# Patient Record
Sex: Female | Born: 1985 | Race: White | Hispanic: No | Marital: Married | Smoking: Never smoker
Health system: Southern US, Community
[De-identification: ages and names within clinical notes are randomized; demographics above are authoritative.]

## PROBLEM LIST (undated history)

## (undated) DIAGNOSIS — J302 Other seasonal allergic rhinitis: Secondary | ICD-10-CM

## (undated) DIAGNOSIS — G43909 Migraine, unspecified, not intractable, without status migrainosus: Secondary | ICD-10-CM

## (undated) DIAGNOSIS — N3281 Overactive bladder: Secondary | ICD-10-CM

## (undated) HISTORY — DX: Overactive bladder: N32.81

## (undated) HISTORY — DX: Migraine, unspecified, not intractable, without status migrainosus: G43.909

---

## 2003-06-25 ENCOUNTER — Other Ambulatory Visit: Admission: RE | Admit: 2003-06-25 | Discharge: 2003-06-25 | Payer: Self-pay | Admitting: Obstetrics & Gynecology

## 2003-09-29 ENCOUNTER — Other Ambulatory Visit: Admission: RE | Admit: 2003-09-29 | Discharge: 2003-09-29 | Payer: Self-pay | Admitting: Obstetrics and Gynecology

## 2004-08-08 ENCOUNTER — Other Ambulatory Visit: Admission: RE | Admit: 2004-08-08 | Discharge: 2004-08-08 | Payer: Self-pay | Admitting: Gynecology

## 2004-12-02 ENCOUNTER — Inpatient Hospital Stay (HOSPITAL_COMMUNITY): Admission: AD | Admit: 2004-12-02 | Discharge: 2004-12-02 | Payer: Self-pay | Admitting: Gynecology

## 2004-12-03 ENCOUNTER — Inpatient Hospital Stay (HOSPITAL_COMMUNITY): Admission: AD | Admit: 2004-12-03 | Discharge: 2004-12-03 | Payer: Self-pay | Admitting: Gynecology

## 2005-01-08 ENCOUNTER — Inpatient Hospital Stay (HOSPITAL_COMMUNITY): Admission: AD | Admit: 2005-01-08 | Discharge: 2005-01-08 | Payer: Self-pay | Admitting: Obstetrics and Gynecology

## 2005-02-01 ENCOUNTER — Inpatient Hospital Stay (HOSPITAL_COMMUNITY): Admission: AD | Admit: 2005-02-01 | Discharge: 2005-02-05 | Payer: Self-pay | Admitting: Gynecology

## 2005-02-01 ENCOUNTER — Inpatient Hospital Stay (HOSPITAL_COMMUNITY): Admission: AD | Admit: 2005-02-01 | Discharge: 2005-02-01 | Payer: Self-pay | Admitting: Gynecology

## 2005-02-02 ENCOUNTER — Encounter (INDEPENDENT_AMBULATORY_CARE_PROVIDER_SITE_OTHER): Payer: Self-pay | Admitting: *Deleted

## 2005-03-15 ENCOUNTER — Other Ambulatory Visit: Admission: RE | Admit: 2005-03-15 | Discharge: 2005-03-15 | Payer: Self-pay | Admitting: Gynecology

## 2006-09-25 IMAGING — US US OB LIMITED
1 series · 13 of 28 positions shown · non-contrast
Comparison: none

CLINICAL DATA: 19 year-old female with 26 week 4 day gestation with pelvic pain and contractions.

[Series 1: us ob limited · 0.37mm/px · 13 of 32 slices shown]
[im 2/32]
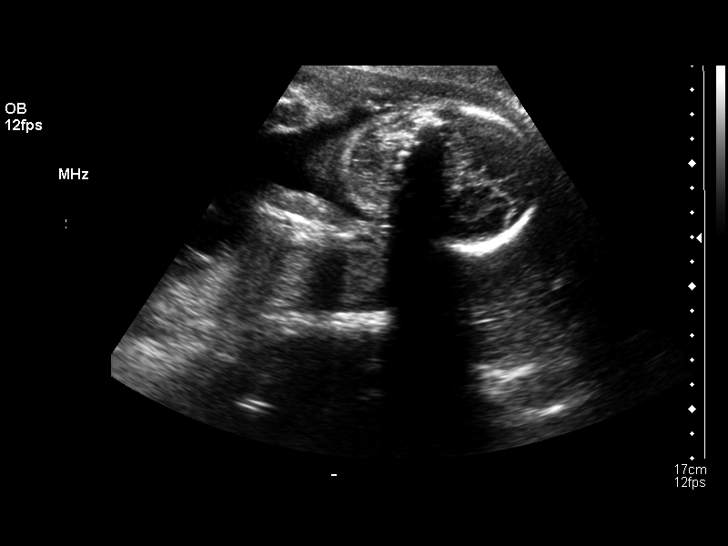
[im 4/32]
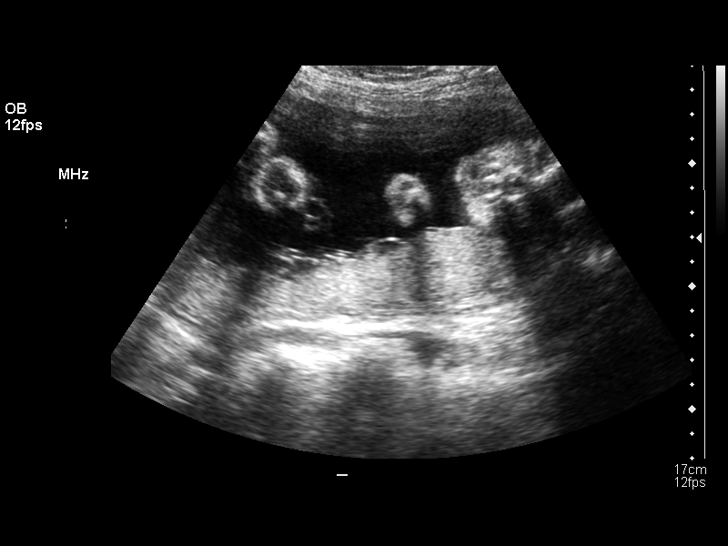
[im 6/32]
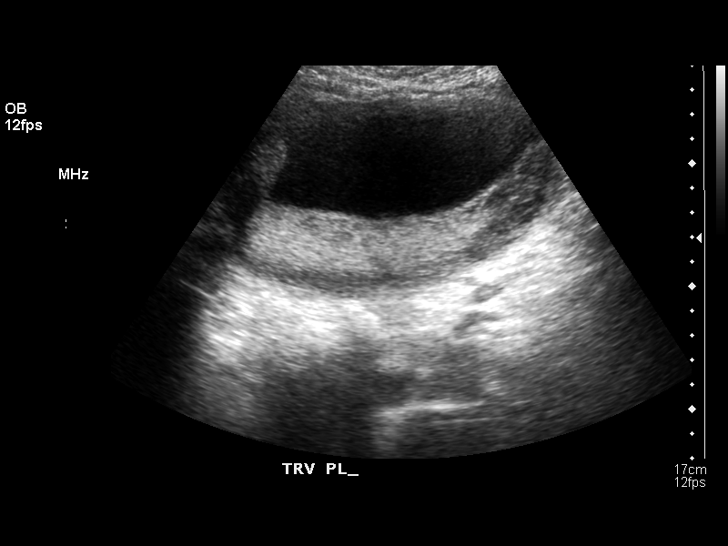
[im 9/32]
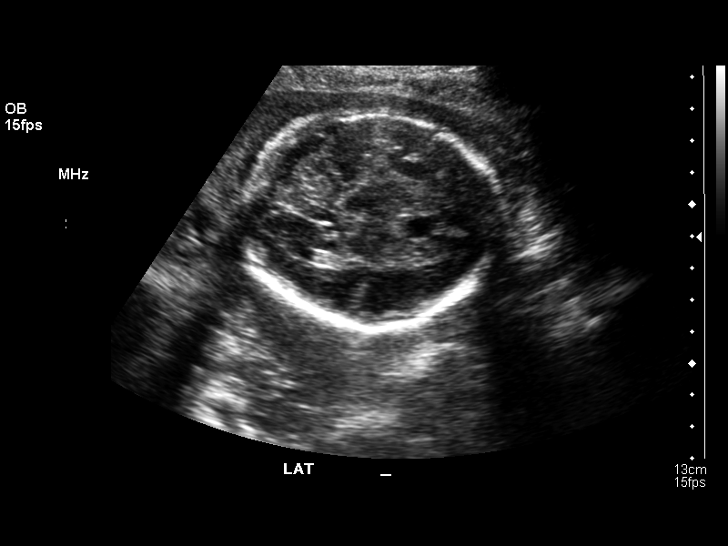
[im 11/32]
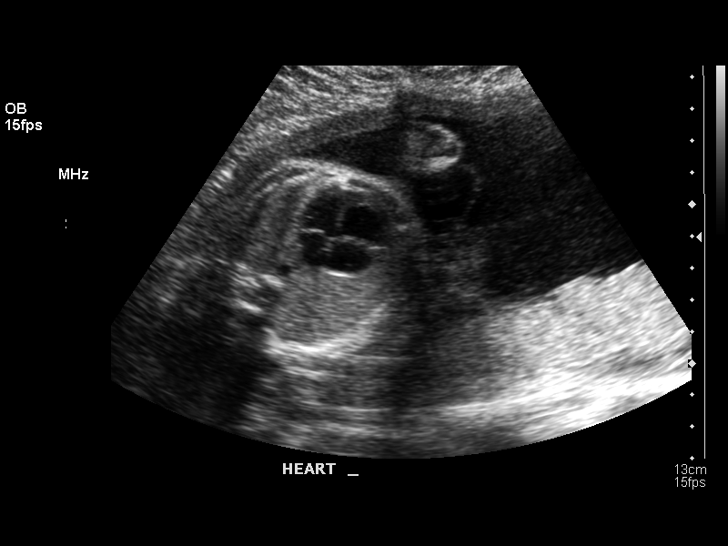
[im 13/32]
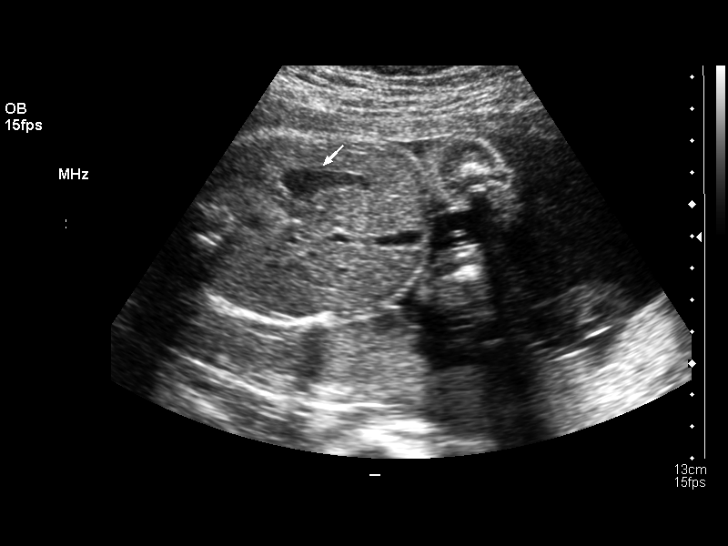
[im 17/32]
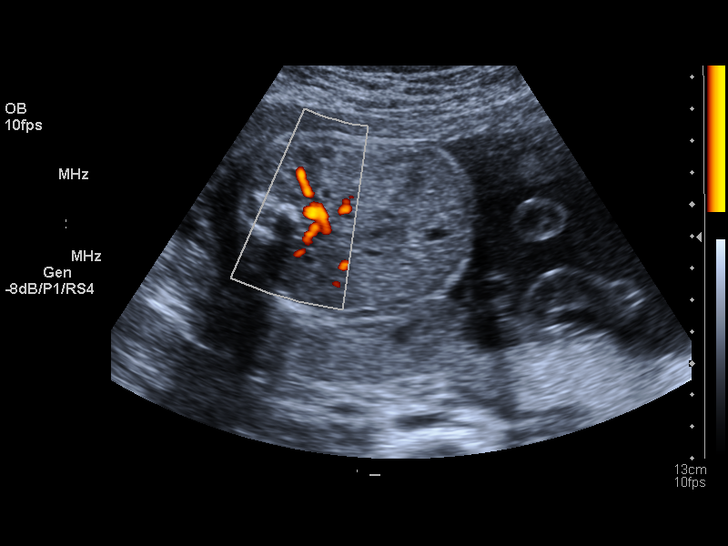
[im 19/32]
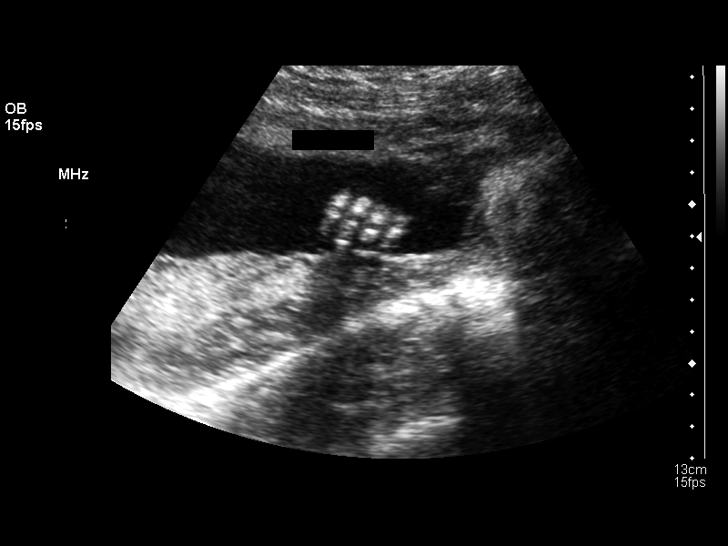
[im 21/32]
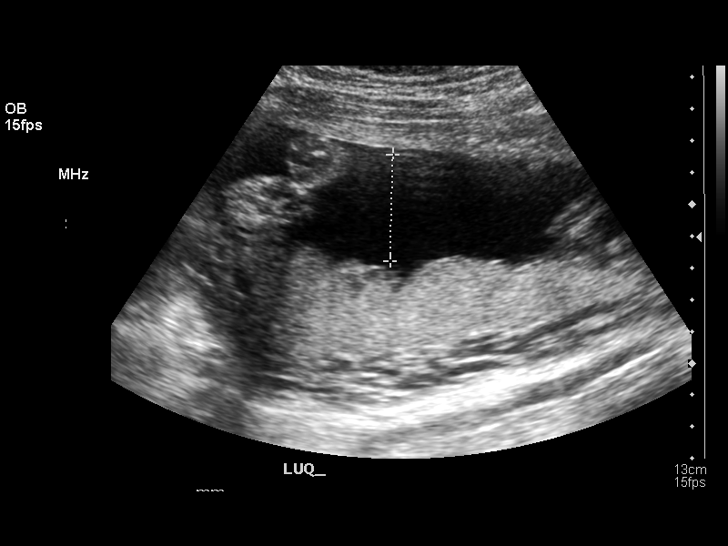
[im 23/32]
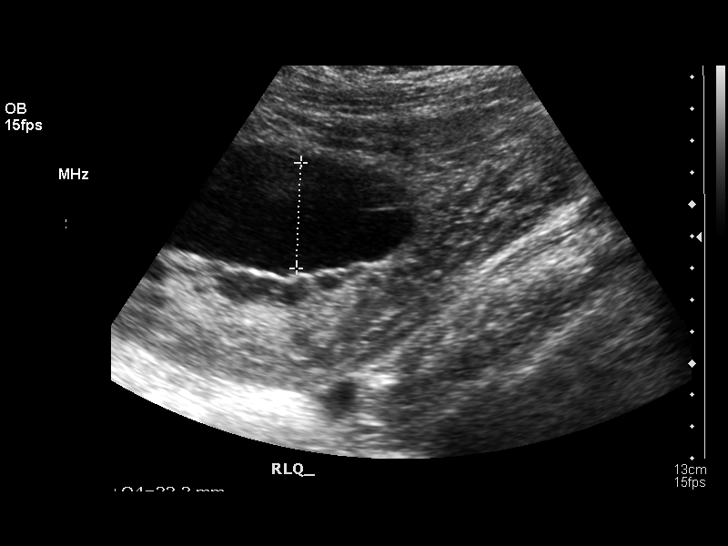
[im 26/32]
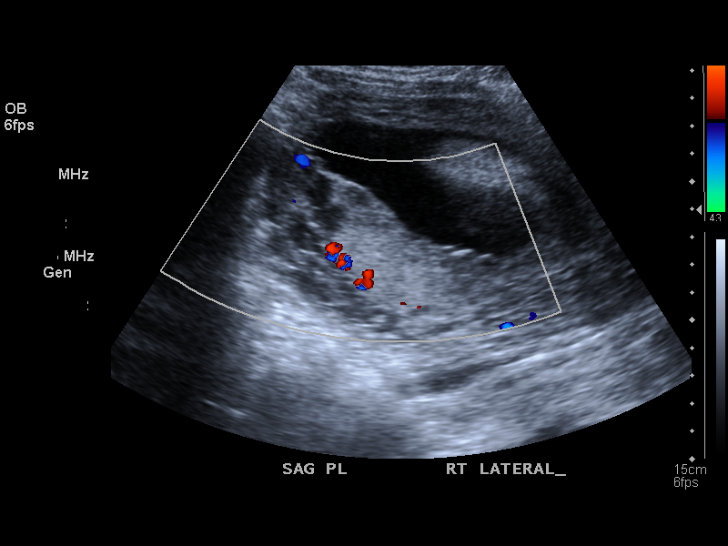
[im 28/32]
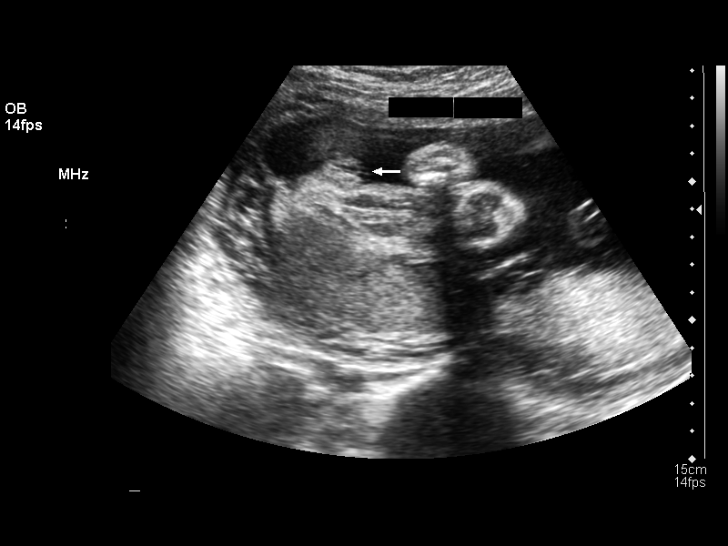
[im 30/32]
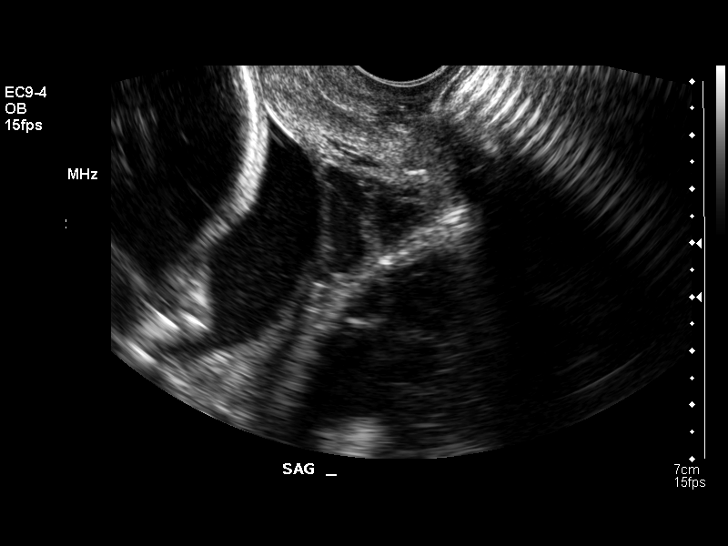

[13 of 28 positions shown; findings below may reference images not displayed]

TRANSABDOMINAL AND TRANSVAGINAL LIMITED OBSTETRICAL EVALUATION:
 Number of Fetuses: 1
 Heart Rate:  131 bpm
 Movement:  yes
 Breathing:  no
 Presentation:  cephalic
 Placental Location:  posterior
 Grade:  I
 Previa:  no 
 Amniotic Fluid (Subjective):  normal
 Amniotic Fluid (Objective):  11.5 cm AFI (3th-63th %ile =  9.5 to 32.6 cm for 27 weeks) 

 Fetal measurements and complete anatomic evaluation were not requested.  The following fetal anatomy was visualized during this exam:  stomach, three-vessel cord, cord insertion site, kidneys, bladder.

 MATERNAL UTERINE AND ADNEXAL FINDINGS
 Cervix: Range of 2.1 to 2.6 cm 
 COMMENT:  Prominent vessels are noted located posterior to the cervix but not near the internal os.
IMPRESSION: 1.  Single living intrauterine gestation in cephalic presentation with assigned gestational age of 26 weeks 4 days.
 2.  Dynamic cervix measuring from 2.1 to 2.6 cm. 
 3.  Prominent vessels posterior to the cervix, probably uterine, but consider follow-up as indicated.
 4.  Limited fetal anatomy from fetal position and slightly advanced gestational age.

## 2007-02-03 ENCOUNTER — Inpatient Hospital Stay (HOSPITAL_COMMUNITY): Admission: AD | Admit: 2007-02-03 | Discharge: 2007-02-03 | Payer: Self-pay | Admitting: Obstetrics and Gynecology

## 2007-02-13 ENCOUNTER — Inpatient Hospital Stay (HOSPITAL_COMMUNITY): Admission: AD | Admit: 2007-02-13 | Discharge: 2007-02-13 | Payer: Self-pay | Admitting: Obstetrics and Gynecology

## 2007-02-14 ENCOUNTER — Inpatient Hospital Stay (HOSPITAL_COMMUNITY): Admission: AD | Admit: 2007-02-14 | Discharge: 2007-02-14 | Payer: Self-pay | Admitting: Obstetrics and Gynecology

## 2007-03-17 ENCOUNTER — Inpatient Hospital Stay (HOSPITAL_COMMUNITY): Admission: AD | Admit: 2007-03-17 | Discharge: 2007-03-20 | Payer: Self-pay | Admitting: Obstetrics and Gynecology

## 2008-03-31 ENCOUNTER — Emergency Department: Payer: Self-pay | Admitting: Emergency Medicine

## 2008-03-31 ENCOUNTER — Other Ambulatory Visit: Payer: Self-pay

## 2009-11-21 ENCOUNTER — Inpatient Hospital Stay (HOSPITAL_COMMUNITY): Admission: AD | Admit: 2009-11-21 | Discharge: 2009-11-24 | Payer: Self-pay | Admitting: Obstetrics and Gynecology

## 2009-11-22 ENCOUNTER — Encounter (INDEPENDENT_AMBULATORY_CARE_PROVIDER_SITE_OTHER): Payer: Self-pay | Admitting: Obstetrics & Gynecology

## 2010-12-13 LAB — URINALYSIS, ROUTINE W REFLEX MICROSCOPIC
Glucose, UA: NEGATIVE mg/dL
Hgb urine dipstick: NEGATIVE
Ketones, ur: NEGATIVE mg/dL
Protein, ur: NEGATIVE mg/dL
Specific Gravity, Urine: <1.005 — ABNORMAL LOW (ref 1.005–1.030)

## 2010-12-17 LAB — CBC
HCT: 38.7 % (ref 36.0–46.0)
Hemoglobin: 12.9 g/dL (ref 12.0–15.0)
MCHC: 33.4 g/dL (ref 30.0–36.0)
MCHC: 33.6 g/dL (ref 30.0–36.0)
MCV: 90.8 fL (ref 78.0–100.0)
MCV: 90.8 fL (ref 78.0–100.0)
Platelets: 138 10*3/uL — ABNORMAL LOW (ref 150–400)
WBC: 10.9 10*3/uL — ABNORMAL HIGH (ref 4.0–10.5)
WBC: 9.7 10*3/uL (ref 4.0–10.5)

## 2010-12-17 LAB — RPR: RPR Ser Ql: NONREACTIVE

## 2011-02-09 NOTE — Discharge Summary (Signed)
NAME:  FINDLEY, BLANKENBAKER            ACCOUNT NO.:  [id number]   MEDICAL RECORD NO.:  [id number]          PATIENT TYPE:  INP   LOCATION:  9132                          FACILITY:  [facility]   PHYSICIAN:  Juan H. Lily Peer, M.D.DATE OF BIRTH:  10-04-1985   DATE OF ADMISSION:  02/01/2005  DATE OF DISCHARGE:  02/05/2005                                 DISCHARGE SUMMARY   HISTORY:  The patient is a 25 year old, gravida 2, para 0, AB 1 who was  admitted on May 11 with a complaint of contractions and was kept overnight  for observation. On the morning of the 12th when she was getting ready to be  discharged, she had spontaneous rupture of membranes and was augmented with  Pitocin. She was GBS positive and had been placed on penicillin G for  prophylaxis. The patient eventually had developed signs of chorioamnionitis  and arrest of cervical dilatation with 3 cm and evidence of fetal  tachycardia and was taken to the operating room where Dr. Loreli Dollar  performed a primary lower uterine segment transverse cesarean section and  delivered a viable female infant weighing 5 pounds 6 ounces with arterial  cord pH of 7.31 and Apgar's of 8 and 9. She had a blood loss of 600 mL. The  patient had been kept on Unasyn antibiotics postoperatively because of the  clinical findings of suspicion for her chorioamnionitis and continued to  remain afebrile on the Unasyn and it was discontinued on the second  postoperative day. She was up and ambulating, voiding well, tolerating a  regular diet well. Her hemoglobin was 11.6. Her blood type was O positive,  she was rubella immune. She was ready to be discharged home on her third  postoperative day. Her incision site was intact. She had passed gas and was  afebrile.   FINAL DIAGNOSES:  1.  Intrauterine pregnancy at 35 weeks and 2/7 days delivered.  2.  Preterm premature rupture of membranes.  3.  Chorioamnionitis.  4.  Arrest of first stage of labor.  5.   Positive group B strep culture.   PROCEDURE:  1.  Fetal monitoring.  2.  Intravenous antibiotics for prophylaxis.  3.  Primary lower uterine segment transverse cesarean section.   DISPOSITION:  The patient was discharged home on her third postoperative  day, she was ambulating, voiding well, tolerating a regular diet well. Her  incision was intact. Her staples to be removed, incision was steri-stripped.  The patient was given a prescription of Lortab 7.5/500 to take 1 p.o. q.4-6h  p.r.n. pain. Also prescription Motrin 800 mg t.i.d. to take p.r.n. She was  instructed to continue her prenatal vitamins and return to the office in six  weeks for a postop visit. Discharge instructions were provided.      JHF/MEDQ  D:  02/05/2005  T:  02/05/2005  Job:  [id number]

## 2011-02-09 NOTE — H&P (Signed)
NAME:  Diana Huang, BARNHARDT            ACCOUNT NO.:  [id number]   MEDICAL RECORD NO.:  [id number]             PATIENT TYPE:  INP   LOCATION:  9169                          FACILITY:  [facility]   PHYSICIAN:  Juan H. Lily Peer, M.D.DATE OF BIRTH:  [birth year]   DATE OF ADMISSION:  02/01/2005  DATE OF DISCHARGE:                                HISTORY & PHYSICAL   CHIEF COMPLAINT:  Contraction.   The patient is a 25 year old, gravida 2, para 0, AB 1.  Estimated date of  confinement, March 06, 2005.  The patient 35-2/[redacted] weeks gestation, presented  to the office today complaining of lower abdominal pressure.  She had been  seen in the office the day before with mild premature contractions.  The  patient had been started on p.o. Terbutaline at [redacted] weeks gestation and the  night before, she had been also in the emergency room with similar  complaints.  She has had several fetal fibronectin tests that were negative.  The most recent one on May 3 was positive, and it was decided to proceed  then with putting her on p.o. Terbutaline.  Today, she was complaining also  of slight vaginal discharge and some abdominal pressure, placed on the  monitor.  She was contracting every 2 minutes apart with a reassuring fetal  heart rate tracing.  The cervix had remained unchanged from the night  before, although the exam was done by a different examiner, but her cervix  was still 1 cm, 70% effaced.  She was fern negative and Nitrazine positive.  The patient had a GBS culture that was drawn yesterday, pending at the time  of dictation.   PAST MEDICAL HISTORY:  The patient with one first trimester elective AB in  2003.  See [employer] form for additional information as well as for review  of systems.   PHYSICAL EXAMINATION:  GENERAL:  She does not appear to be in any acute  distress despite the findings seen on the monitor.  HEART:  Regular rate and rhythm, no murmurs or gallop.  LUNGS:  Clear to auscultation without  rhonchi or wheezes.  ABDOMEN:  Soft, nontender, with no rebound or guarding.  PELVIC EXAM:  Cervix 1 cm, 70% effaced.  EXTREMITIES:  Trace edema.  Negative clonus.  DTR 1+/   PRENATAL LABORATORY:  O positive blood type, negative antibody screen, VDRL  was nonreactive, rubella immune, hepatitis B surface antigen and HIV were  negative.  A maternal __________ alpha-fetoprotein was normal.  Diabetes  screen was normal.  CBC and __________ were also in the normal range.   ULTRASOUNDS:  The patient has had several ultrasounds due to the fact that  early in her pregnancy she had placenta previa which had resolved, and  patient also has had a history of an anterior hematoma located in the  anterior wall and continued to decrease in size; on the last ultrasound on  April 17, it measured 3.4 x 1.8 cm.  The patient denies any vaginal  bleeding.  The patient with history of short cervix throughout her  pregnancy.   ASSESSMENT:  A  25 year old gravida 2, para 0, AB 1 at 35-2.7 weeks'  gestation with premature contractions, cervix unchanged, will admit to  [facility] for IV hydration, monitorization, and possibly initiation  of Pen-G for prophylaxis if continues to contract and starts having a  cervical change, since we do not have the group G culture yet.  If  contractions resolve, then we will plan on letting her go home tomorrow.  We  will monitor throughout the  night in the hospital.  We discussed all the above with the patient, and all  questions are answered, and we will follow accordingly.   PLAN:  As per assessment above.      JHF/MEDQ  D:  02/01/2005  T:  02/01/2005  Job:  [id number]

## 2011-02-09 NOTE — Op Note (Signed)
NAME:  Diana Huang, Diana Huang            ACCOUNT NO.:  [id number]   MEDICAL RECORD NO.:  [id number]          PATIENT TYPE:  INP   LOCATION:  9132                          [facility]ACILITY:  [facility]   PHYSICIAN:  Charles A. Delcambre, MDDATE OF BIRTH:  10/01/1985   DATE OF PROCEDURE:  02/02/2005  DATE OF DISCHARGE:                                 OPERATIVE REPORT   PREOPERATIVE DIAGNOSES:  1.  Intrauterine pregnancy 35 weeks 2 days.  2.  Chorioamnionitis.  3.  Arrest of dilation 3 cm.  Remote from delivery.  4.  Fetal tachycardia.   POSTOPERATIVE DIAGNOSES:  1.  Intrauterine pregnancy 35 weeks 2 days.  2.  Chorioamnionitis.  3.  Arrest of dilation 3 cm.  Remote from delivery.  4.  Fetal tachycardia.   PROCEDURES:  Primary low transverse cesarean section.   SURGEON:  Dr. Sydnee Cabal   ASSISTANT:  Scrub tech.   ANESTHESIA:  Epidural.   SPECIMENS:  Placenta to pathology.   OPERATIVE FINDINGS:  Cord arterial blood gas 7.31.  Cord venous blood gas  7.35.  Baby weight 5 pounds 6 ounces, Apgars 8 and 9.   ESTIMATED BLOOD LOSS:  600 mL.   COMPLICATIONS:  None.   INSTRUMENT, SPONGE AND NEEDLE COUNT:  Correct x 2.   DESCRIPTION OF PROCEDURE:  The patient was taken to the operating room,  placed in supine position.  After epidural was dosed, anesthesia was  adequate, a sterile prep and drape was undertaken.  Pfannenstiel incision  was made with a knife, carried down to fascia.  Fascia was incised with the  knife and Mayo scissors.  Rectus sheath was released superiorly and  inferiorly.  Rectus muscles were sharply dissected in the midline.  Peritoneum was entered with the Metzenbaum scissors without damage to bowel,  bladder, or vascular structures.  Traction was used to extend the incision.  Bladder blade was placed.  Vesicouterine peritoneum was then incised with  the Metzenbaum scissors.  Blunt dissection was used to develop the bladder  flap.  A lower uterine segment transverse incision was  made to amniotomy.  Traction was used to extend the incision.  Hand was inserted.  Occiput was  lifted out through the incision site.  Fundal pressure by the operator's  assistant.  Infant was delivered without difficulty.  Cord was quadruply  clamped, cut free.  The foot was touched to the mom's hand sterilely.  The  baby was shown to the parents and handed off to neonatologist in attendance,  Dr. Alison Murray.  Placenta was manually expressed after cord blood and cord gases  were taken.  The anterior surface of the uterus was wiped with a moistened  lap.  The uterus was then closed in two layers, first #1 chromic running  locking, second #1 chromic imbricating over and running nonlocking.  Irrigation was carried out copiously at he pericolic gutters and bladder  flap area.  Hemostasis was excellent.  Subfascial hemostasis was excellent.  The fascia was closed with #1 Vicryl running nonlocking sutures.  Subcutaneous hemostasis was excellent.  Skin was closed with sterile skin  clips.  Sterile dressing was  applied.  The patient tolerated the procedure  well and was taken to recovery with physician in attendance.      CAD/MEDQ  D:  02/02/2005  T:  02/04/2005  Job:  [id number]

## 2011-07-11 LAB — CBC
HCT: 33.7 — ABNORMAL LOW
HCT: 41.1
Hemoglobin: 11.5 — ABNORMAL LOW
Hemoglobin: 13.6
MCHC: 34.1
MCV: 87.5
Platelets: 184
RBC: 4.65
RDW: 14
WBC: 12.6 — ABNORMAL HIGH

## 2011-07-11 LAB — RPR: RPR Ser Ql: NONREACTIVE

## 2012-12-05 ENCOUNTER — Encounter (HOSPITAL_COMMUNITY): Payer: Self-pay | Admitting: Family Medicine

## 2012-12-05 ENCOUNTER — Emergency Department (INDEPENDENT_AMBULATORY_CARE_PROVIDER_SITE_OTHER): Payer: Medicaid Other

## 2012-12-05 ENCOUNTER — Emergency Department (HOSPITAL_COMMUNITY)
Admission: EM | Admit: 2012-12-05 | Discharge: 2012-12-05 | Disposition: A | Discharge status: Home / Self Care | Payer: Medicaid Other | Source: Home / Self Care

## 2012-12-05 DIAGNOSIS — R05 Cough: Secondary | ICD-10-CM

## 2012-12-05 DIAGNOSIS — R071 Chest pain on breathing: Secondary | ICD-10-CM

## 2012-12-05 DIAGNOSIS — R0781 Pleurodynia: Secondary | ICD-10-CM

## 2012-12-05 HISTORY — DX: Other seasonal allergic rhinitis: J30.2

## 2012-12-05 MED ORDER — PROMETHAZINE-CODEINE 6.25-10 MG/5ML PO SYRP: 5.0000 mL | ORAL_SOLUTION | ORAL | Status: DC | PRN

## 2012-12-05 NOTE — ED Provider Notes (Signed)
History     CSN: [id number]  Arrival date & time 12/05/12  1656   First MD Initiated Contact with Patient 12/05/12 1734      No chief complaint on file.   (Consider location/radiation/quality/duration/timing/severity/associated sxs/prior treatment) HPI 27 y.o. with URI 1 month ago. Treated for bronchitis with z-pack but still coughing. And now rib cage hurts right side. Pain is constant, worse with coughing and lying on right side. Also having coughing attacks with gagging, can't catch breath, thick mucous (white or clear).  Whole family has been sick with same, prolonged cough. Husband smokes. No fever or chills. No wheezing but does have chest tightness, shortness of breath. No history asthma. Has tried ibuprofen. Vicodin cough syrup but didn't help. Was taking mucinex OTC with cough suppressant.    Past Medical History  Diagnosis Date  . Seasonal allergic rhinitis     Past Surgical History  Procedure Laterality Date  . Cesarean section     C-section.  No family history on file. Mother with asthma/allergies  History  Substance Use Topics  . Smoking status: Second/third hand smoke exposure.  . Smokeless tobacco: Not on file  . Alcohol Use: Not on file    OB History   No data available      Review of Systems  Constitutional: Negative for fever, chills and activity change.  HENT: Positive for rhinorrhea and postnasal drip. Negative for sore throat and sinus pressure.   Respiratory: Positive for cough, chest tightness and shortness of breath. Negative for wheezing.   Cardiovascular: Positive for chest pain (right side).  Gastrointestinal: Positive for vomiting. Negative for nausea.  Skin: Negative for rash.  Neurological: Negative for dizziness and light-headedness.    Allergies  Review of patient's allergies indicates not on file.  Home Medications  No current outpatient prescriptions on file.  BP 134/76  Pulse 80  Temp(Src) 97.9 F (36.6 C) (Oral)  Resp 18   SpO2 100%  Physical Exam  Constitutional: She is oriented to person, place, and time. She appears well-developed and well-nourished. No distress.  HENT:  Head: Normocephalic and atraumatic.  Mouth/Throat: Oropharynx is clear and moist. No oropharyngeal exudate.  Eyes: Conjunctivae and EOM are normal. Pupils are equal, round, and reactive to light. Right eye exhibits no discharge. Left eye exhibits no discharge.  Neck: Normal range of motion. Neck supple.  Cardiovascular: Normal rate, regular rhythm and normal heart sounds.   Pulmonary/Chest: Effort normal and breath sounds normal. No respiratory distress. She has no wheezes. She has no rales. She exhibits tenderness (right lateral and inferior to breast).  Abdominal: Soft. Bowel sounds are normal. There is no tenderness. There is no rebound and no guarding.  Musculoskeletal: Normal range of motion. She exhibits no edema and no tenderness.  Lymphadenopathy:    She has no cervical adenopathy.  Neurological: She is alert and oriented to person, place, and time.  Skin: Skin is warm and dry.  Psychiatric: She has a normal mood and affect.     *RADIOLOGY REPORT*  Clinical Data: Cough and shortness of breath.  CHEST - 2 VIEW  Comparison: None  Findings: The cardiomediastinal silhouette is unremarkable.  The lungs are clear.  There is no evidence of focal airspace disease, pulmonary edema,  suspicious pulmonary nodule/mass, pleural effusion, or  pneumothorax.  No acute bony abnormalities are identified. A mild apex left  thoracolumbar scoliosis is present.  IMPRESSION:  No evidence of active cardiopulmonary disease.    ED Course  Procedures (including critical  care time)  Labs Reviewed - No data to display No results found.  27 y.o. female with chest pain and cough 1. Pleuritic chest pain   2. Post-viral cough syndrome    Pain musculoskeletal - no cxr changes. Cough likely post-viral vs pertussis. No antibiotics  indicated. Codeine-based cough medication for cough and pain. Decongestant, supportive measures. Has appt with PCP on Monday - suggest f/u with him/her and discuss testing for pertussis and/or treatment with inhaled steroids.  Napoleon Form, MD       Napoleon Form, MD 12/05/12 2224

## 2012-12-05 NOTE — ED Notes (Addendum)
C/o cough for 1 month, runny nose, pain in R ribs all the way around to her back, and occ. SOB.  Finished Z -pak 1 week ago without relief.  She has been on Hydrocodone cough syrup without relief.  Pain progressively worse since last Sat.

## 2013-04-07 ENCOUNTER — Ambulatory Visit (INDEPENDENT_AMBULATORY_CARE_PROVIDER_SITE_OTHER): Payer: Medicaid Other | Admitting: Family Medicine

## 2013-04-07 ENCOUNTER — Encounter: Payer: Self-pay | Admitting: Family Medicine

## 2013-04-07 VITALS — BP 118/70 | HR 82 | Temp 99.1°F | Resp 14 | Ht 63.0 in | Wt 128.0 lb

## 2013-04-07 DIAGNOSIS — N318 Other neuromuscular dysfunction of bladder: Secondary | ICD-10-CM

## 2013-04-07 DIAGNOSIS — N3281 Overactive bladder: Secondary | ICD-10-CM

## 2013-04-07 DIAGNOSIS — H9190 Unspecified hearing loss, unspecified ear: Secondary | ICD-10-CM

## 2013-04-07 DIAGNOSIS — G43909 Migraine, unspecified, not intractable, without status migrainosus: Secondary | ICD-10-CM

## 2013-04-07 MED ORDER — PROPRANOLOL HCL ER 60 MG PO CP24: 60.0000 mg | ORAL_CAPSULE | Freq: Every day | ORAL | Status: DC

## 2013-04-07 NOTE — Progress Notes (Signed)
Subjective:    Patient ID: Diana Huang, female    DOB: 1986-05-21, 27 y.o.   MRN: [id number]  HPI  Patient presents today with several concerns.  #1 she has had episodes where she'll experience ringing in the ears and hearing loss.  This is happened occasionally. It is never associated with vertigo. The last time she tried to clean ears out with Q-tip and experienced some bleeding. She like her ear is evaluated. Her hearing screen today in office is normal.    She also complains of frequent migraines. She has 1-3 migraines every week. She's been on beta blockers in the past with success but had to stop and do 2 hypertension. She like to start something to prevent her migraines.  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 It happens occasionally.  She also complains of polyuria and urge incontinence. She states  she has to micturate frequently. The urge will hit her suddenly and sometimes she can't make it to the bathroom in time. Past Medical History  Diagnosis Date  . Seasonal allergic rhinitis   . Migraines    No current outpatient prescriptions on file prior to visit.   No current facility-administered medications on file prior to visit.   No Known Allergies History   Social History  . Marital Status: Married    Spouse Name: N/A    Number of Children: N/A  . Years of Education: N/A   Occupational History  . Not on file.   Social History Main Topics  . Smoking status: Never Smoker   . Smokeless tobacco: Not on file  . Alcohol Use: No  . Drug Use: No  . Sexually Active: Yes    Birth Control/ Protection: IUD   Other Topics Concern  . Not on file   Social History Narrative  . No narrative on file     Review of Systems  All other systems reviewed and are negative.       Objective:   Physical Exam  Vitals reviewed. Constitutional: She is oriented to person, place, and time. She appears well-developed and  well-nourished.  HENT:  Right Ear: Tympanic membrane normal. Right ear exhibits lacerations (small punctate area of bleeding at 1:00 from the right tympanic membrane.  ).  Left Ear: Tympanic membrane normal.  Ears:  Eyes: Conjunctivae are normal. Pupils are equal, round, and reactive to light.  Neck: Neck supple.  Cardiovascular: Normal rate and regular rhythm.   Pulmonary/Chest: Effort normal and breath sounds normal.  Neurological: She is alert and oriented to person, place, and time. She has normal reflexes. No cranial nerve deficit. Coordination normal.          Assessment & Plan:  1. Migraines Begin Inderal LA 60 mg by mouth daily. Watch for hypotension. Will use this as a preventative to try to decrease the frequency of migraines - propranolol ER (INDERAL LA) 60 MG 24 hr capsule; Take 1 capsule (60 mg total) by mouth daily.  Dispense: 30 capsule; Refill: 2  2. Hearing loss, unspecified laterality Aside from the area of trauma due to the Q-tip seen in the right external auditory canal, patient's hearing is normal. We discussed possible Mnire's disease, but she has no vertigo. The time being I reassured the patient that her exam is normal and her hearing is normal and we will monitor for further recurrences. If vertigo develops will need to have her evaluated for her Mnire's disease.  I do not think  the patient has an acoustic neuroma.  3. OAB (overactive bladder) If she tolerates the propranolol, she would be interested in taking a medicine for overactive bladder. Consider VESIcare 5 mg by mouth daily.  Also consider interstitial cystitis as a possible cause.

## 2013-04-23 ENCOUNTER — Telehealth: Payer: Self-pay | Admitting: Family Medicine

## 2013-04-23 ENCOUNTER — Other Ambulatory Visit: Payer: Self-pay | Admitting: Family Medicine

## 2013-04-23 MED ORDER — SOLIFENACIN SUCCINATE 10 MG PO TABS: 10.0000 mg | ORAL_TABLET | Freq: Every day | ORAL | Status: DC

## 2013-04-23 NOTE — Telephone Encounter (Signed)
[facility]icare sent to [facility].

## 2013-04-23 NOTE — Telephone Encounter (Signed)
Pt aware via voicemail 

## 2013-05-05 ENCOUNTER — Ambulatory Visit (INDEPENDENT_AMBULATORY_CARE_PROVIDER_SITE_OTHER): Payer: Medicaid Other | Admitting: Family Medicine

## 2013-05-05 ENCOUNTER — Encounter: Payer: Self-pay | Admitting: Family Medicine

## 2013-05-05 VITALS — BP 118/72 | HR 86 | Temp 98.7°F | Resp 14 | Wt 126.0 lb

## 2013-05-05 DIAGNOSIS — J020 Streptococcal pharyngitis: Secondary | ICD-10-CM

## 2013-05-05 DIAGNOSIS — J029 Acute pharyngitis, unspecified: Secondary | ICD-10-CM

## 2013-05-05 DIAGNOSIS — N3281 Overactive bladder: Secondary | ICD-10-CM | POA: Insufficient documentation

## 2013-05-05 LAB — RAPID STREP SCREEN (MED CTR MEBANE ONLY): Streptococcus, Group A Screen (Direct): NEGATIVE

## 2013-05-05 MED ORDER — AMOXICILLIN 875 MG PO TABS: 875.0000 mg | ORAL_TABLET | Freq: Two times a day (BID) | ORAL | Status: DC

## 2013-05-05 NOTE — Progress Notes (Signed)
Subjective:    Patient ID: Diana Huang, female    DOB: 20-Jul-1986, 27 y.o.   MRN: [id number]  HPI One month ago the patient's son developed mono.  He was sick for approximately 2 weeks. She thereafter her other son developed a sore throat but tested negative for mono.  Most recently her daughter has developed a sore throat. The patient also is complaining of sore throat x2 weeks postnasal drip sinus drainage a nonproductive cough and fatigue.  She denies any fevers or chills. She denies any shortness of breath. She denies any sinus pain. Her biggest concern is that her throat is still sore. Past Medical History  Diagnosis Date  . Seasonal allergic rhinitis   . Migraines   . OAB (overactive bladder)    Current Outpatient Prescriptions on File Prior to Visit  Medication Sig Dispense Refill  . propranolol ER (INDERAL LA) 60 MG 24 hr capsule Take 1 capsule (60 mg total) by mouth daily.  30 capsule  2  . solifenacin (VESICARE) 10 MG tablet Take 1 tablet (10 mg total) by mouth daily.  30 tablet  2   No current facility-administered medications on file prior to visit.   No Known Allergies History   Social History  . Marital Status: Married    Spouse Name: N/A    Number of Children: N/A  . Years of Education: N/A   Occupational History  . Not on file.   Social History Main Topics  . Smoking status: Never Smoker   . Smokeless tobacco: Not on file  . Alcohol Use: No  . Drug Use: No  . Sexually Active: Yes    Birth Control/ Protection: IUD   Other Topics Concern  . Not on file   Social History Narrative  . No narrative on file   No family history on file.    Review of Systems  All other systems reviewed and are negative.       Objective:   Physical Exam  Vitals reviewed. Constitutional: She appears well-developed and well-nourished.  HENT:  Right Ear: Tympanic membrane and external ear normal.  Left Ear: Tympanic membrane and external ear normal.  Nose: Nose  normal.  Mouth/Throat: Oropharynx is clear and moist and mucous membranes are normal. No oropharyngeal exudate, posterior oropharyngeal edema or posterior oropharyngeal erythema.  Neck: Neck supple.  Cardiovascular: Normal rate, regular rhythm and normal heart sounds.   No murmur heard. Pulmonary/Chest: Effort normal and breath sounds normal. No respiratory distress. She has no wheezes. She has no rales.  Abdominal: Soft. Bowel sounds are normal. She exhibits no distension. There is no tenderness.  Lymphadenopathy:    She has no cervical adenopathy.          Assessment & Plan:  1. Streptococcal sore throat Strep screen is negative. I do not think the patient has streptococcal pharyngitis. Therefore antibiotics are not indicated at this time. - Rapid strep screen  2. Acute pharyngitis I feel the patient most likely has a viral pharyngitis. She has had mono when she was younger. I will not check for mono today. I recommended symptomatic therapy with Sudafed for congestion Chloraseptic spray for sore throat and ibuprofen for pain and swelling. If symptoms are not better in one week I have asked her to get the amoxicillin.  Otherwise do not fill the amoxicillin and allow the tincture of time. - amoxicillin (AMOXIL) 875 MG tablet; Take 1 tablet (875 mg total) by mouth 2 (two) times daily.  Dispense: 20  tablet; Refill: 0

## 2013-05-20 ENCOUNTER — Telehealth: Payer: Self-pay | Admitting: Family Medicine

## 2013-05-20 MED ORDER — FLUCONAZOLE 150 MG PO TABS: 150.0000 mg | ORAL_TABLET | Freq: Once | ORAL | Status: DC

## 2013-05-20 NOTE — Telephone Encounter (Signed)
Pt states that since starting the propranolol she has been really cold and her fingernails beds have been turning blue. Her BP this AM was 103/65 with pulse if 69. Could this be coming from the medication and is it anything for her to worry about?

## 2013-05-20 NOTE — Telephone Encounter (Signed)
Can you ok this please?

## 2013-05-20 NOTE — Telephone Encounter (Signed)
Med sent to pharm and Patient aware  

## 2013-05-20 NOTE — Telephone Encounter (Signed)
Diflucan 150mg  po x 1, may repeat in 3 days if not improved Dispense 2 tablets

## 2013-05-21 ENCOUNTER — Encounter: Payer: Self-pay | Admitting: Family Medicine

## 2013-05-21 ENCOUNTER — Ambulatory Visit (INDEPENDENT_AMBULATORY_CARE_PROVIDER_SITE_OTHER): Payer: Medicaid Other | Admitting: Family Medicine

## 2013-05-21 ENCOUNTER — Telehealth: Payer: Self-pay | Admitting: Family Medicine

## 2013-05-21 VITALS — BP 110/78 | HR 78 | Temp 98.7°F | Resp 14 | Wt 127.0 lb

## 2013-05-21 DIAGNOSIS — R031 Nonspecific low blood-pressure reading: Secondary | ICD-10-CM

## 2013-05-21 NOTE — Telephone Encounter (Signed)
Patient aware.

## 2013-05-21 NOTE — Telephone Encounter (Signed)
Pt aware and is coming in for appt today at 12:30.

## 2013-05-21 NOTE — Telephone Encounter (Signed)
Hold propranolol for low BP.  NTBS for color change because that is not likely from propranolol.

## 2013-05-21 NOTE — Progress Notes (Signed)
  Subjective:    Patient ID: Diana Huang, female    DOB: 22-Feb-1986, 27 y.o.   MRN: [id number]  HPI Patient takes propranolol for migraine prevention. She is on 60 mg by mouth daily. Her blood pressure has been averaging around 100/50 at home.  She is normally asymptomatic.  She may feel a little dizzy. She became concerned because she felt her fingernails were turning purple.  At present her fingernails are pink and well perfused she has normal radial and ulnar pulses.  Capillary refill is 3 seconds. There are no abnormalities on her exam and  her blood pressure is normal. Past Medical History  Diagnosis Date  . Seasonal allergic rhinitis   . Migraines   . OAB (overactive bladder)    Current Outpatient Prescriptions on File Prior to Visit  Medication Sig Dispense Refill  . fluconazole (DIFLUCAN) 150 MG tablet Take 1 tablet (150 mg total) by mouth once. And may repeat in 3 days if no better  2 tablet  0  . propranolol ER (INDERAL LA) 60 MG 24 hr capsule Take 1 capsule (60 mg total) by mouth daily.  30 capsule  2  . solifenacin (VESICARE) 10 MG tablet Take 1 tablet (10 mg total) by mouth daily.  30 tablet  2   No current facility-administered medications on file prior to visit.   No Known Allergies History   Social History  . Marital Status: Married    Spouse Name: N/A    Number of Children: N/A  . Years of Education: N/A   Occupational History  . Not on file.   Social History Main Topics  . Smoking status: Never Smoker   . Smokeless tobacco: Not on file  . Alcohol Use: No  . Drug Use: No  . Sexual Activity: Yes    Birth Control/ Protection: IUD   Other Topics Concern  . Not on file   Social History Narrative  . No narrative on file      Review of Systems  All other systems reviewed and are negative.       Objective:   Physical Exam  Vitals reviewed. Cardiovascular: Normal rate, normal heart sounds and intact distal pulses.  Exam reveals no gallop and no  friction rub.   No murmur heard. Pulses:      Dorsalis pedis pulses are 2+ on the right side, and 2+ on the left side.       Posterior tibial pulses are 2+ on the right side, and 2+ on the left side.  Pulmonary/Chest: Effort normal and breath sounds normal. No respiratory distress. She has no wheezes. She has no rales.  Musculoskeletal: She exhibits no edema.  Skin: Skin is warm. No rash noted. No erythema. No pallor.          Assessment & Plan:  1. Low blood pressure reading I reassured the patient that I felt her blood pressure is safe, but she would still like to discontinue the propranolol. Disscontinue the propranolol but  monitor her migraine frequency closely. If her migraines return we may need to consider topamax.  I believe the color change in her fingers were likely due to vasoconstriction. I doubt true Raynaud's phenomenon. Monitor clinically for now as her exam is completely normal.

## 2013-05-21 NOTE — Telephone Encounter (Signed)
I do not feel the cold or color change is due to propranolol.  BP and pulse are safe.  Hard to say without seeing her but I doubt its anything to be concerned about.  Possible Raynaud's phenomenon.

## 2013-05-21 NOTE — Telephone Encounter (Signed)
Pt says that her bp is getting really low. Last night it was 100/54 and this morning it was 95/65. Her fingernails are purple and her hands are constantly cold. What should she do?

## 2013-08-07 ENCOUNTER — Other Ambulatory Visit: Payer: Self-pay | Admitting: Family Medicine

## 2013-11-06 ENCOUNTER — Encounter: Payer: Self-pay | Admitting: Family Medicine

## 2013-11-06 ENCOUNTER — Ambulatory Visit (INDEPENDENT_AMBULATORY_CARE_PROVIDER_SITE_OTHER): Payer: Medicaid Other | Admitting: Family Medicine

## 2013-11-06 VITALS — BP 118/72 | HR 78 | Temp 98.8°F | Resp 14 | Wt 131.0 lb

## 2013-11-06 DIAGNOSIS — J209 Acute bronchitis, unspecified: Secondary | ICD-10-CM

## 2013-11-06 DIAGNOSIS — I7300 Raynaud's syndrome without gangrene: Secondary | ICD-10-CM

## 2013-11-06 MED ORDER — HYDROCODONE-HOMATROPINE 5-1.5 MG/5ML PO SYRP: 5.0000 mL | ORAL_SOLUTION | Freq: Three times a day (TID) | ORAL | Status: DC | PRN

## 2013-11-06 MED ORDER — AZITHROMYCIN 250 MG PO TABS: ORAL_TABLET | ORAL | Status: DC

## 2013-11-06 NOTE — Progress Notes (Signed)
Subjective:    Patient ID: Diana Huang, female    DOB: 11/05/1985, 28 y.o.   MRN: [id number]  HPI  patient reports a one-week history of worsening cough, chest congestion. The cough is nonproductive. She also has a frontal sinus headache. She denies any fevers or chills. She has some mild shortness of breath. She is also complaining that her fingers turn purple in color when it is cold outside. She states it is staying and burn when this happens. Family she may have Raynaud's phenomenon. Past Medical History  Diagnosis Date  . Seasonal allergic rhinitis   . Migraines   . OAB (overactive bladder)    Current Outpatient Prescriptions on File Prior to Visit  Medication Sig Dispense Refill  . VESICARE 10 MG tablet TAKE 1 TABLET BY MOUTH DAILY  30 tablet  2   No current facility-administered medications on file prior to visit.   No Known Allergies History   Social History  . Marital Status: Married    Spouse Name: N/A    Number of Children: N/A  . Years of Education: N/A   Occupational History  . Not on file.   Social History Main Topics  . Smoking status: Never Smoker   . Smokeless tobacco: Not on file  . Alcohol Use: No  . Drug Use: No  . Sexual Activity: Yes    Birth Control/ Protection: IUD   Other Topics Concern  . Not on file   Social History Narrative  . No narrative on file      Review of Systems  All other systems reviewed and are negative.       Objective:   Physical Exam  Constitutional: She appears well-developed and well-nourished.  HENT:  Right Ear: Tympanic membrane, external ear and ear canal normal.  Left Ear: Tympanic membrane, external ear and ear canal normal.  Nose: Mucosal edema and rhinorrhea present. Right sinus exhibits frontal sinus tenderness.  Mouth/Throat: Oropharynx is clear and moist. No oropharyngeal exudate.  Eyes: Conjunctivae are normal. No scleral icterus.  Neck: Neck supple.  Cardiovascular: Normal rate, regular  rhythm and normal heart sounds.   Pulmonary/Chest: Effort normal and breath sounds normal. No respiratory distress. She has no wheezes. She has no rales. She exhibits no tenderness.  Abdominal: Soft. Bowel sounds are normal.  Musculoskeletal: She exhibits no edema.  Lymphadenopathy:    She has no cervical adenopathy.   I had the patient hold ice packs in both hands for approximately 2 minutes. The patient did develop a purplish hue in her nailbeds.       Assessment & Plan:  1. Acute bronchitis I believe the patient has viral bronchitis. I recommended tincture of time. I recommended Mucinex DM every 12 hours as needed for cough. I gave patient prescription for Hycodan 1 teaspoon every 8 hours as needed for cough. If the symptoms worsen and she develops a high fever or if the cough becomes productive of purulent sputum I gave the patient a prescription for a Z-Pak for her to get only at that point. I explained to the patient that if it is viral bronchitis a Z-Pak will not help. - HYDROcodone-homatropine (HYCODAN) 5-1.5 MG/5ML syrup; Take 5 mLs by mouth every 8 (eight) hours as needed for cough.  Dispense: 120 mL; Refill: 0 - azithromycin (ZITHROMAX) 250 MG tablet; 2 tabs poqday1, 1 tab poqday 2-5  Dispense: 6 tablet; Refill:  I reassured the patient that I believe she has a mild case of Raynaud's  phenomenon.  I do not feel that any treatment is needed at this time.  When the patient is better she is interested in trying Topamax for migraine prevention.

## 2014-01-06 ENCOUNTER — Other Ambulatory Visit: Payer: Self-pay | Admitting: Family Medicine

## 2014-01-06 NOTE — Telephone Encounter (Signed)
Medication refilled per protocol. 

## 2014-05-06 ENCOUNTER — Other Ambulatory Visit: Payer: Self-pay | Admitting: Family Medicine

## 2014-05-06 NOTE — Telephone Encounter (Signed)
Refill appropriate and filled per protocol. 

## 2014-09-08 ENCOUNTER — Encounter: Payer: Self-pay | Admitting: Family Medicine

## 2014-09-08 ENCOUNTER — Ambulatory Visit (INDEPENDENT_AMBULATORY_CARE_PROVIDER_SITE_OTHER): Payer: Medicaid Other | Admitting: Family Medicine

## 2014-09-08 VITALS — BP 118/62 | HR 82 | Temp 98.9°F | Resp 14 | Ht 64.0 in | Wt 133.0 lb

## 2014-09-08 DIAGNOSIS — L259 Unspecified contact dermatitis, unspecified cause: Secondary | ICD-10-CM

## 2014-09-08 MED ORDER — METHYLPREDNISOLONE ACETATE 40 MG/ML IJ SUSP
40.0000 mg | Freq: Once | INTRAMUSCULAR | Status: AC
  Administered 2014-09-08: 40 mg via INTRAMUSCULAR

## 2014-09-08 MED ORDER — PREDNISONE 10 MG PO TABS: ORAL_TABLET | ORAL | Status: DC

## 2014-09-08 NOTE — Patient Instructions (Addendum)
Start prednisone tomorrow Shot given today  Call if not getting better  F/u as needed

## 2014-09-08 NOTE — Addendum Note (Signed)
Addended by: Phillips OdorSIX, CHRISTINA H on: 09/08/2014 10:40 AM   Modules accepted: Orders

## 2014-09-08 NOTE — Progress Notes (Signed)
Patient ID: Diana Huang, female   DOB: [birth year], 28 y.o.   MRN: [id number]   Subjective:    Patient ID: Diana GlaserJamie L Huang, female    DOB: [birth year], 28 y.o.   MRN: [id number]  Patient presents for Rash rash on her right side. She does have history of shingles. She was outside clearing some brush a day before the rash she did not have any tingling or few small red itchy spots that started a couple days. She did try calamine lotion as well as hydrocortisone cortisone actually referred her skin a little. She is not taking medications no new silver detergent. No systemic symptoms. No other family members with rash    Review Of Systems:  GEN- denies fatigue, fever, weight loss,weakness, recent illness HEENT- denies eye drainage, change in vision, nasal discharge, CVS- denies chest pain, palpitations RESP- denies SOB, cough, wheeze ABD- denies N/V, change in stools, abd pain GU- denies dysuria, hematuria, dribbling, incontinence MSK- denies joint pain, muscle aches, injury Neuro- denies headache, dizziness, syncope, seizure activity       Objective:    BP 118/62 mmHg  Pulse 82  Temp(Src) 98.9 F (37.2 C) (Oral)  Resp 14  Ht 5\' 4"  (1.626 m)  Wt 133 lb (60.328 kg)  BMI 22.82 kg/m2 GEN- NAD, alert and oriented x3 Skin- erythematous maculopapular lesions on right abdominal side wall, large slightly raised erythematous patch, + blanching, no vesciles, no blisters, no drainage, 2 smaller sattelite like maculopapular lesions below umbicilus        Assessment & Plan:      Problem List Items Addressed This Visit    None    Visit Diagnoses    Contact dermatitis    -  Primary    Most likley contact dermatitis, given Depo Medrol, start prednisone tomorrow, no systemic symptoms       Note: This dictation was prepared with Dragon dictation along with smaller phrase technology. Any transcriptional errors that result from this process are unintentional.

## 2014-09-09 ENCOUNTER — Ambulatory Visit: Payer: Medicaid Other | Admitting: Family Medicine

## 2014-09-20 ENCOUNTER — Encounter: Payer: Self-pay | Admitting: Family Medicine

## 2014-09-20 ENCOUNTER — Ambulatory Visit (INDEPENDENT_AMBULATORY_CARE_PROVIDER_SITE_OTHER): Payer: Medicaid Other | Admitting: Family Medicine

## 2014-09-20 VITALS — BP 130/76 | HR 82 | Temp 98.9°F | Resp 14 | Wt 133.0 lb

## 2014-09-20 DIAGNOSIS — R21 Rash and other nonspecific skin eruption: Secondary | ICD-10-CM

## 2014-09-20 MED ORDER — CLOTRIMAZOLE-BETAMETHASONE 1-0.05 % EX CREA: 1.0000 "application " | TOPICAL_CREAM | Freq: Two times a day (BID) | CUTANEOUS | Status: DC

## 2014-09-20 MED ORDER — HYDROXYZINE PAMOATE 25 MG PO CAPS: 25.0000 mg | ORAL_CAPSULE | Freq: Three times a day (TID) | ORAL | Status: DC | PRN

## 2014-09-20 NOTE — Progress Notes (Signed)
Patient ID: Diana Huang, female   DOB: [birth year], 28 y.o.   MRN: [id number]   Subjective:    Patient ID: Diana GlaserJamie L Huang, female    DOB: [birth year], 28 y.o.   MRN: [id number]  Patient presents for Rash patient here with recurrent rash on her right lower abdomen. She was seen about 2 weeks ago on the 16th that time I diagnosed with what looks like a contact dermatitis she was given Depo-Medrol as well as a prednisone taper she states that the rash dried up living came back and the itching never really stopped. She did not have any blisters or vesicles in the lesion. No fever no new lesions.    Review Of Systems:  GEN- denies fatigue, fever, weight loss,weakness, recent illness HEENT- denies eye drainage, change in vision, nasal discharge, CVS- denies chest pain, palpitations RESP- denies SOB, cough, wheeze ABD- denies N/V, change in stools, abd pain GU- denies dysuria, hematuria, dribbling, incontinence MSK- denies joint pain, muscle aches, injury Neuro- denies headache, dizziness, syncope, seizure activity       Objective:    BP 130/76 mmHg  Pulse 82  Temp(Src) 98.9 F (37.2 C) (Oral)  Resp 14  Wt 133 lb (60.328 kg) GEN- NAD, alert and oriented x3 Skin- raised erythematous rash, with rough texture,right abdominal side wall, ( square patch) + blanching, no vesciles, no blisters, no drainage, 2 smaller sattelite like maculopapular lesions below umbicilus         Assessment & Plan:      Problem List Items Addressed This Visit    None    Visit Diagnoses    Rash and nonspecific skin eruption    -  Primary    Did not respond to prednisone very well fairly well circumcised patch , no systemic symptoms, no true fungal elements on KOH, no sign of superinfection, refer to derm, vistaril, lotrisone for now       Note: This dictation was prepared with Dragon dictation along with smaller Lobbyistphrase technology. Any transcriptional errors that result from this process are  unintentional.

## 2014-09-20 NOTE — Patient Instructions (Signed)
Apply the topical cream twice a day  Use the vistaril as needed for itching Referral to dermatology

## 2014-09-28 IMAGING — CR DG CHEST 2V
2 series · 2 of 2 positions shown · non-contrast
Comparison: None

CLINICAL DATA: Cough and shortness of breath.

CHEST - 2 VIEW

[view not recorded (1 of 2)]
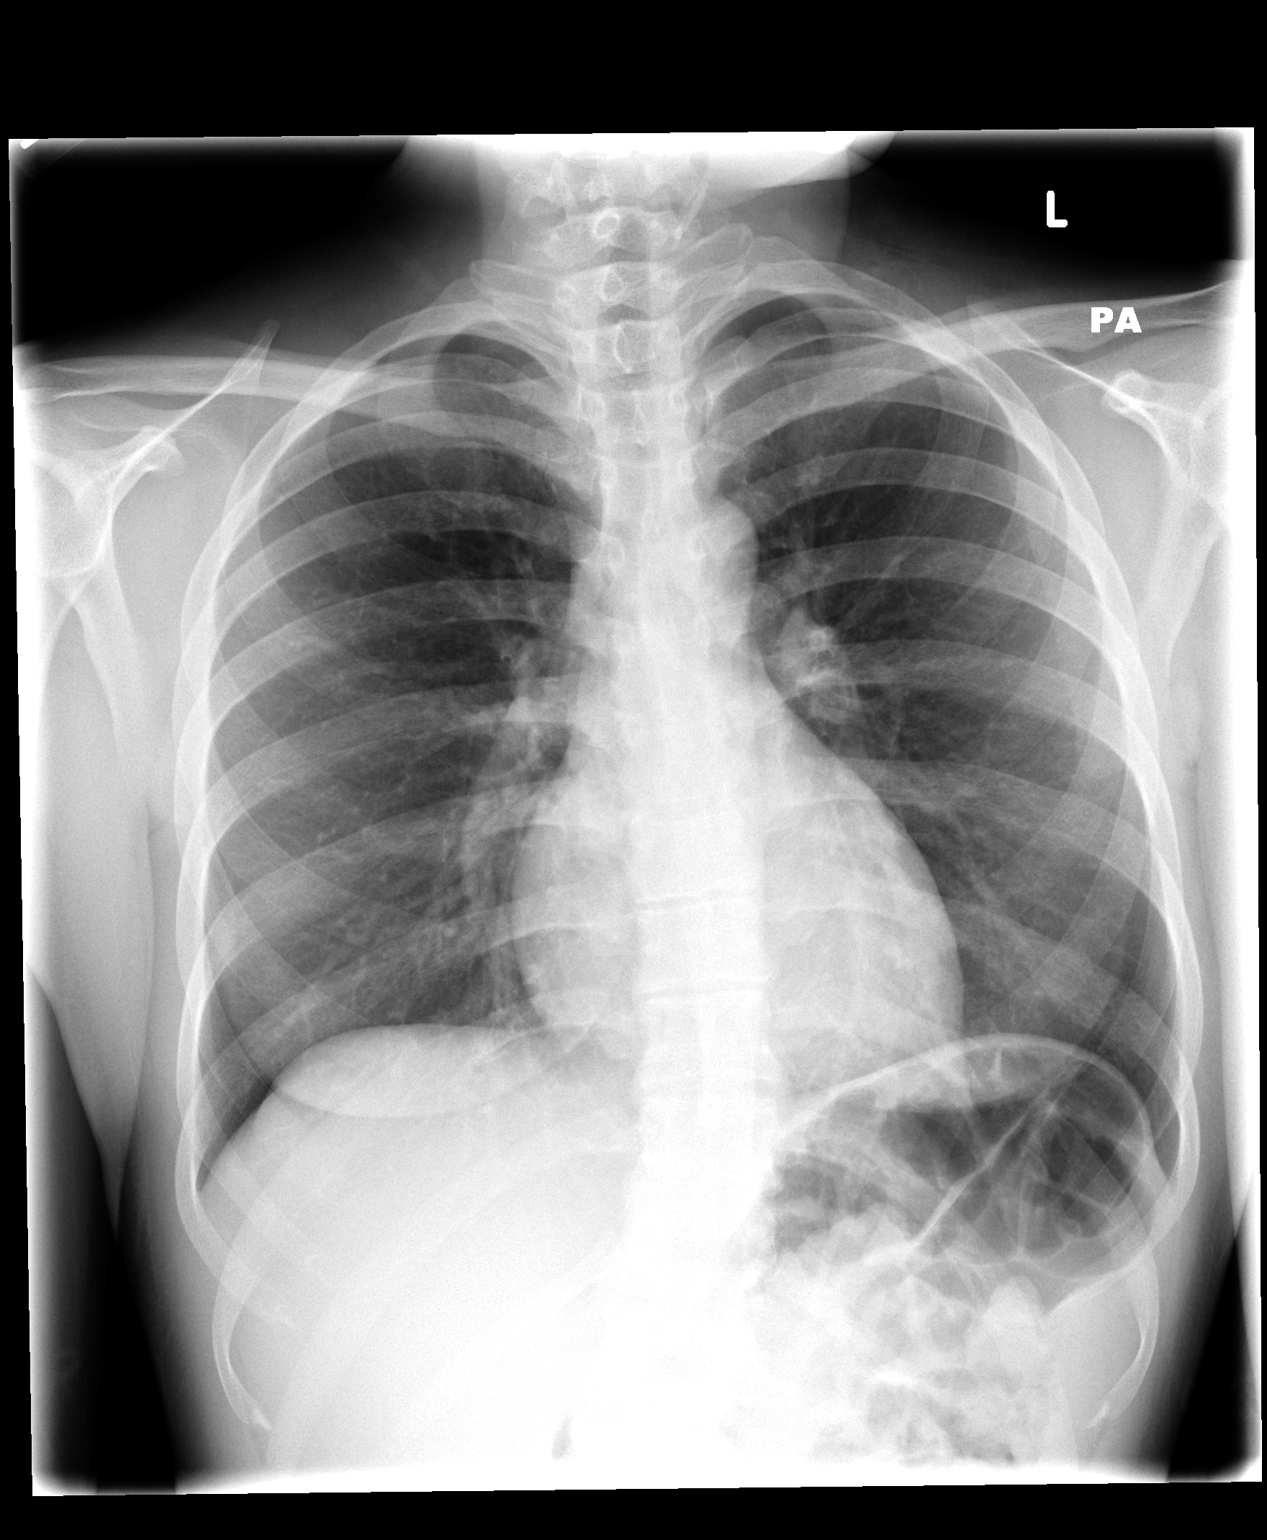

[view not recorded (2 of 2)]
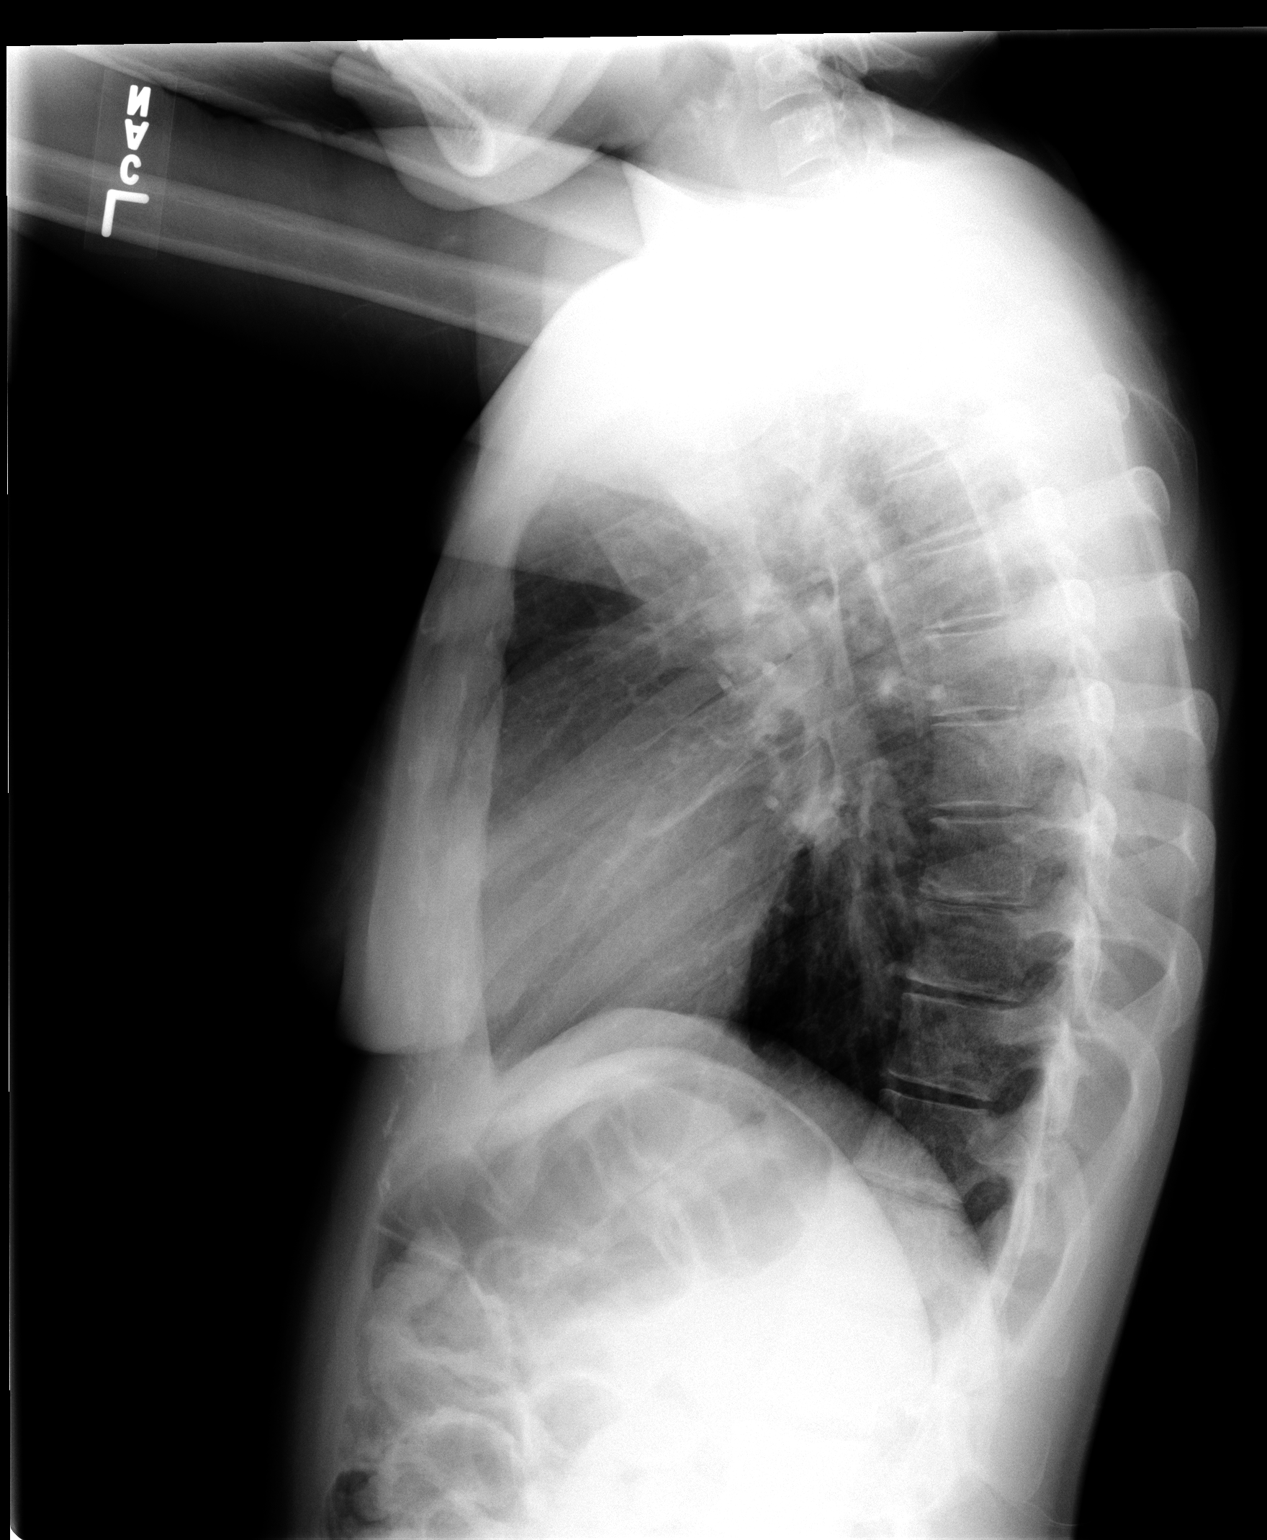

[2 of 2 positions shown; findings below may reference images not displayed]

FINDINGS: The cardiomediastinal silhouette is unremarkable.
The lungs are clear.
There is no evidence of focal airspace disease, pulmonary edema,
suspicious pulmonary nodule/mass, pleural effusion, or
pneumothorax.
No acute bony abnormalities are identified. A mild apex left
thoracolumbar scoliosis is present.
IMPRESSION: No evidence of active cardiopulmonary disease.

## 2014-10-25 ENCOUNTER — Other Ambulatory Visit: Payer: Self-pay | Admitting: Obstetrics and Gynecology

## 2014-10-26 LAB — CYTOLOGY - PAP

## 2015-03-07 ENCOUNTER — Encounter: Payer: Self-pay | Admitting: Family Medicine

## 2015-03-07 ENCOUNTER — Ambulatory Visit (INDEPENDENT_AMBULATORY_CARE_PROVIDER_SITE_OTHER): Payer: Medicaid Other | Admitting: Family Medicine

## 2015-03-07 VITALS — BP 118/64 | HR 70 | Temp 98.9°F | Resp 12 | Ht 64.0 in | Wt 133.0 lb

## 2015-03-07 DIAGNOSIS — W57XXXA Bitten or stung by nonvenomous insect and other nonvenomous arthropods, initial encounter: Secondary | ICD-10-CM

## 2015-03-07 DIAGNOSIS — M25551 Pain in right hip: Secondary | ICD-10-CM | POA: Diagnosis not present

## 2015-03-07 DIAGNOSIS — T148 Other injury of unspecified body region: Secondary | ICD-10-CM

## 2015-03-07 MED ORDER — DOXYCYCLINE HYCLATE 100 MG PO TABS: 100.0000 mg | ORAL_TABLET | Freq: Two times a day (BID) | ORAL | Status: AC

## 2015-03-07 NOTE — Progress Notes (Signed)
Patient ID: Diana Huang, female   DOB: Jan 31, 1986, 29 y.o.   MRN: [id number]   Subjective:    Patient ID: Diana Huang, female    DOB: 11-01-1985, 29 y.o.   MRN: [id number]  Patient presents for Tick Bite  patient here with tick bite to her lower back 3 days ago her husband noticed a red ring around it which is starting to clear some. She's had some nausea but no fever. She also has another type of bug bite on her thigh which was initially swollen like an ache but has gone down. She's not had any drainage from either sites.  She also complains of hip pain which is chronic in nature since she had her last child about 5 years ago. At times depending on which way she sleeps she will have pain on her hip she also has history of some sciatica. She takes ibuprofen and tries to stretch and this does help with the pain. Neither any tingling numbness in her extremities denies any injury otherwise no change in bowel or bladder    Review Of Systems:  GEN- denies fatigue, fever, weight loss,weakness, recent illness HEENT- denies eye drainage, change in vision, nasal discharge, CVS- denies chest pain, palpitations RESP- denies SOB, cough, wheeze ABD- denies N/V, change in stools, abd pain GU- denies dysuria, hematuria, dribbling, incontinence MSK- + joint pain, muscle aches, injury Neuro- denies headache, dizziness, syncope, seizure activity       Objective:    BP 118/64 mmHg  Pulse 70  Temp(Src) 98.9 F (37.2 C) (Oral)  Resp 12  Ht 5\' 4"  (1.626 m)  Wt 133 lb (60.328 kg)  BMI 22.82 kg/m2 GEN- NAD, alert and oriented x3 Skin- Right lower back- erythematous maculopapular lesions with mild erythema surrounding, Right lateral thigh 2cm insect bite, +induration, no fluctance, mild TTP MSK- Spine NT, neg SLR, good ROM bilat hips, motor 5/5/ bilat        Assessment & Plan:      Problem List Items Addressed This Visit    None    Visit Diagnoses    Tick bites    -  Primary    Tick bite and insect bite to thigh, cover with doxy based on appearance, topical cortisone for itching    Right hip pain        Given reassurance, imaging will be low yield, continue stretching, NSAIDS PRN, if worsens or new symptoms imaging L spine and Hip       Note: This dictation was prepared with Dragon dictation along with smaller phrase technology. Any transcriptional errors that result from this process are unintentional.

## 2015-03-07 NOTE — Patient Instructions (Signed)
Take antibiotics as prescribed Use cortisone cream as prescribed  F/U as needed

## 2015-07-20 ENCOUNTER — Other Ambulatory Visit: Payer: Self-pay | Admitting: Family Medicine

## 2015-07-20 NOTE — Telephone Encounter (Signed)
Refill appropriate and filled per protocol. 

## 2015-11-19 ENCOUNTER — Other Ambulatory Visit: Payer: Self-pay | Admitting: Family Medicine

## 2015-12-05 ENCOUNTER — Telehealth: Payer: Self-pay | Admitting: Family Medicine

## 2015-12-05 NOTE — Telephone Encounter (Signed)
Pt [facility] a Medicaid [facility] in order to be seen. Please call pt with any questions  [phone]

## 2015-12-06 NOTE — Telephone Encounter (Signed)
Contacted pt and advised her that her medicaid card has [facility] on it and that [facility] will not accept it as long as it has that information on it. I advised pt she needs to contact her SW with Medicaid to get it changed before I can get her scheduled

## 2016-01-13 ENCOUNTER — Telehealth: Payer: Self-pay | Admitting: Family Medicine

## 2016-01-13 NOTE — Telephone Encounter (Signed)
Patient is calling to say that she needs to referral to go to her obgyn  Please call her at [phone]

## 2016-01-20 NOTE — Telephone Encounter (Signed)
Ne[facility] to [facility] to give NPI # for Medicaid to cover her yearly exam.  They were called and pt made aware.
